# Patient Record
Sex: Female | Born: 2000 | Race: White | Hispanic: No | Marital: Single | Smoking: Former smoker
Health system: Southern US, Community
[De-identification: ages and names within clinical notes are randomized; demographics above are authoritative.]

## PROBLEM LIST (undated history)

## (undated) DIAGNOSIS — F32A Depression, unspecified: Secondary | ICD-10-CM

## (undated) DIAGNOSIS — F909 Attention-deficit hyperactivity disorder, unspecified type: Secondary | ICD-10-CM

## (undated) DIAGNOSIS — F419 Anxiety disorder, unspecified: Secondary | ICD-10-CM

## (undated) HISTORY — DX: Depression, unspecified: F32.A

## (undated) HISTORY — DX: Anxiety disorder, unspecified: F41.9

## (undated) HISTORY — PX: WISDOM TOOTH EXTRACTION: SHX21

## (undated) HISTORY — DX: Attention-deficit hyperactivity disorder, unspecified type: F90.9

## (undated) HISTORY — PX: COLONOSCOPY: SHX174

---
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
## 2021-01-15 ENCOUNTER — Other Ambulatory Visit: Payer: Self-pay | Admitting: Family Medicine

## 2021-01-20 ENCOUNTER — Other Ambulatory Visit: Payer: Self-pay

## 2021-01-20 ENCOUNTER — Telehealth: Payer: Self-pay | Admitting: Family Medicine

## 2021-01-20 MED ORDER — SERTRALINE HCL 25 MG PO TABS: ORAL_TABLET | ORAL | 0 refills | Status: DC

## 2021-01-20 NOTE — Telephone Encounter (Signed)
Pt called in asking for a refill of sertraline (ZOLOFT) 100 MG tablet . Pt was a Dr. Deirdre Peer pt but has since found a new PCP. Pt stated that first appt is 7/6 and cannot get a prescription filled until after that appt. Pt is asking just for a 30 day supply as she takes 25mg  tablet 3x's a day. Please advise.  Cb#: [id number]

## 2021-01-20 NOTE — Telephone Encounter (Signed)
Message sent to [place] to ok more med for pt due to incorrect sig on 90 day supply

## 2021-09-24 ENCOUNTER — Encounter: Payer: Self-pay | Admitting: Nurse Practitioner

## 2021-09-24 ENCOUNTER — Other Ambulatory Visit: Payer: Self-pay

## 2021-09-24 ENCOUNTER — Ambulatory Visit (INDEPENDENT_AMBULATORY_CARE_PROVIDER_SITE_OTHER): Admitting: Nurse Practitioner

## 2021-09-24 VITALS — BP 116/66 | Ht 68.5 in | Wt 127.0 lb

## 2021-09-24 DIAGNOSIS — Z01419 Encounter for gynecological examination (general) (routine) without abnormal findings: Secondary | ICD-10-CM | POA: Diagnosis not present

## 2021-09-24 DIAGNOSIS — Z30431 Encounter for routine checking of intrauterine contraceptive device: Secondary | ICD-10-CM | POA: Diagnosis not present

## 2021-09-24 DIAGNOSIS — Z113 Encounter for screening for infections with a predominantly sexual mode of transmission: Secondary | ICD-10-CM | POA: Diagnosis not present

## 2021-09-24 NOTE — Progress Notes (Signed)
° °  Amy Shields 07/09/2001 [id number]   History:  21 y.o. G0 presents for annual exam without GYN complaints. Mirena IUD inserted January 2021, occasional light spotting. Received Gardasil series. GAD, depression managed by PCP.   Gynecologic History No LMP recorded. (Menstrual status: IUD).   Contraception/Family planning: IUD Sexually active: Yes  Health Maintenance Last Pap: N/A Last mammogram: N/A Last colonoscopy: N/A Last Dexa: N/A  Past medical history, past surgical history, family history and social history were all reviewed and documented in the EPIC chart. 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   ROS:  A ROS was performed and pertinent positives and negatives are included.  Exam:  Vitals:   09/24/21 1337  BP: 116/66  Weight: 127 lb (57.6 kg)  Height: 5' 8.5" (1.74 m)    Body mass index is 19.03 kg/m.  General appearance:  Normal Thyroid:  Symmetrical, normal in size, without palpable masses or nodularity. Respiratory  Auscultation:  Clear without wheezing or rhonchi Cardiovascular  Auscultation:  Regular rate, without rubs, murmurs or gallops  Edema/varicosities:  Not grossly evident Abdominal  Soft,nontender, without masses, guarding or rebound.  Liver/spleen:  No organomegaly noted  Hernia:  None appreciated  Skin  Inspection:  Grossly normal Breasts: Not indicated per guidelines Genitourinary   Inguinal/mons:  Normal without inguinal adenopathy  External genitalia:  Normal appearing vulva with no masses, tenderness, or lesions  BUS/Urethra/Skene's glands:  Normal  Vagina:  Normal appearing with normal color and discharge, no lesions  Cervix:  Normal appearing without discharge or lesions. IUD string visible  Uterus:  Normal in size, shape and contour.  Midline and mobile, nontender  Adnexa/parametria:     Rt: Normal in size, without masses or tenderness.   Lt: Normal in size, without masses or tenderness.  Anus and perineum: Normal  Digital  rectal exam: Not indicated  Patient informed chaperone available to be present for breast and pelvic exam. Patient has requested no chaperone to be present. Patient has been advised what will be completed during breast and pelvic exam.   Assessment/Plan:  21 y.o. G0 for annual exam.   Well female exam with routine gynecological exam - Education provided on SBEs, importance of preventative screenings, current guidelines, high calcium diet, regular exercise, safe sex, and multivitamin daily.   Encounter for routine checking of intrauterine contraceptive device (IUD) - Mirena inserted 08/2019. Occasional spotting. Aware of 8-year FDA approval. IUD string visible during exam.   Screen for STD (sexually transmitted disease) - Plan: SURESWAB CT/NG/T. Vaginalis. Declines RPR, HIV.   Follow up in 1 year for annual.      [place], 1:56 PM 09/24/2021

## 2021-09-25 LAB — SURESWAB CT/NG/T. VAGINALIS
C. trachomatis RNA, TMA: NOT DETECTED
N. gonorrhoeae RNA, TMA: NOT DETECTED
Trichomonas vaginalis RNA: NOT DETECTED

## 2021-09-28 ENCOUNTER — Encounter: Payer: Self-pay | Admitting: Nurse Practitioner

## 2022-09-10 ENCOUNTER — Ambulatory Visit: Admitting: Family

## 2022-09-11 IMAGING — US US BREAST*R* LIMITED INC AXILLA
1 series · 4 of 4 positions shown · non-contrast
Comparison: None.

CLINICAL DATA: Palpable lump within the RIGHT breast.

EXAM:
ULTRASOUND OF THE RIGHT BREAST

[Series 1: us breast*right* limited inc axilla · 0.06mm/px · 4 of 4 slices shown]
[im 1/4]
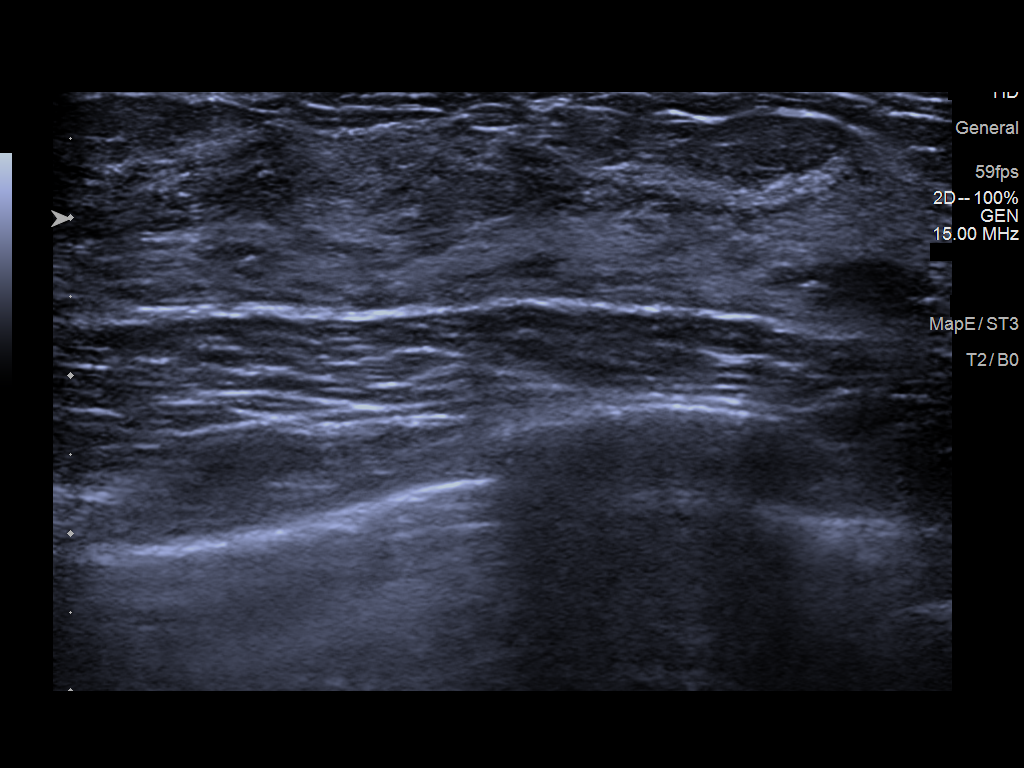
[im 2/4]
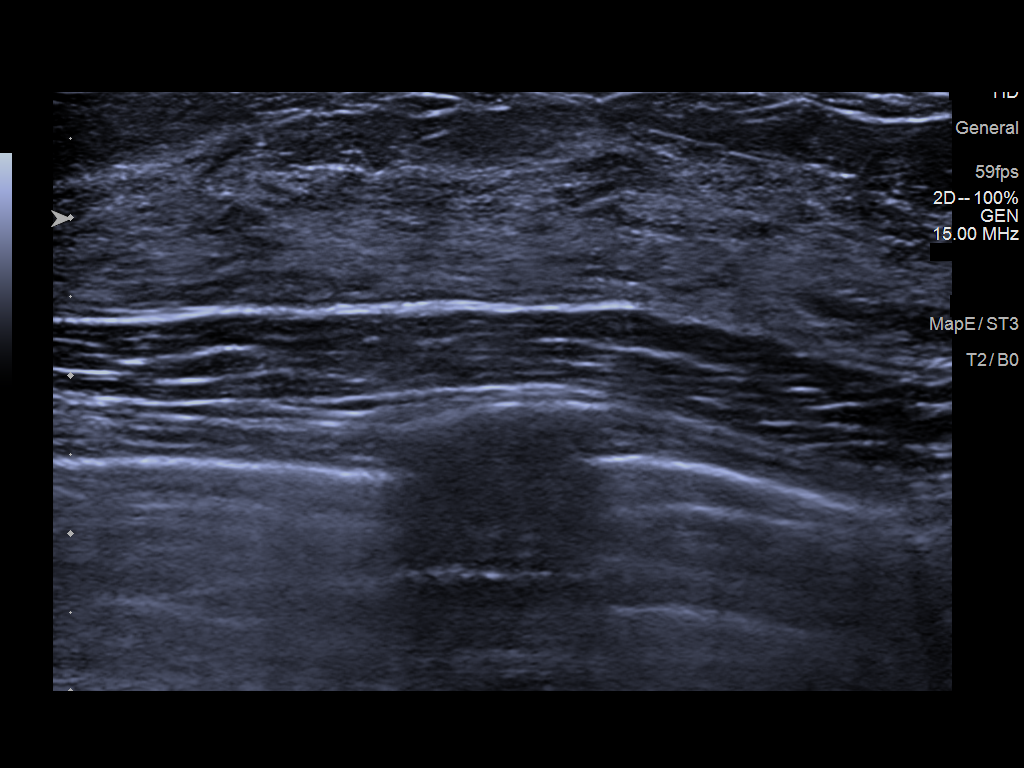
[im 3/4]
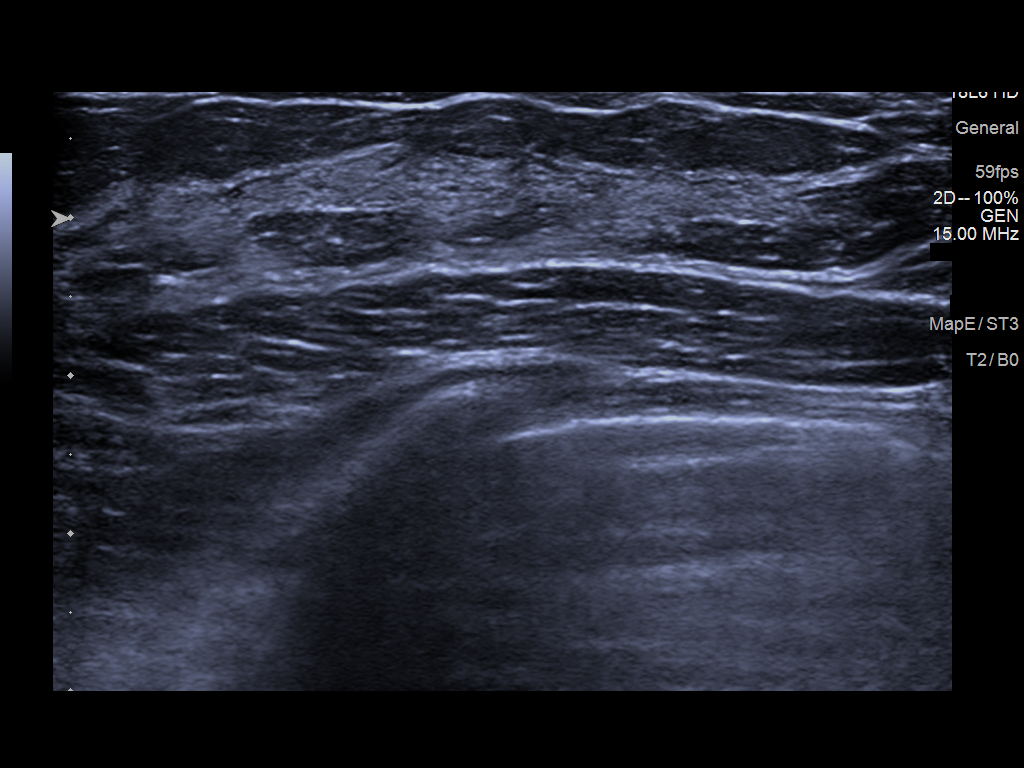
[im 4/4]
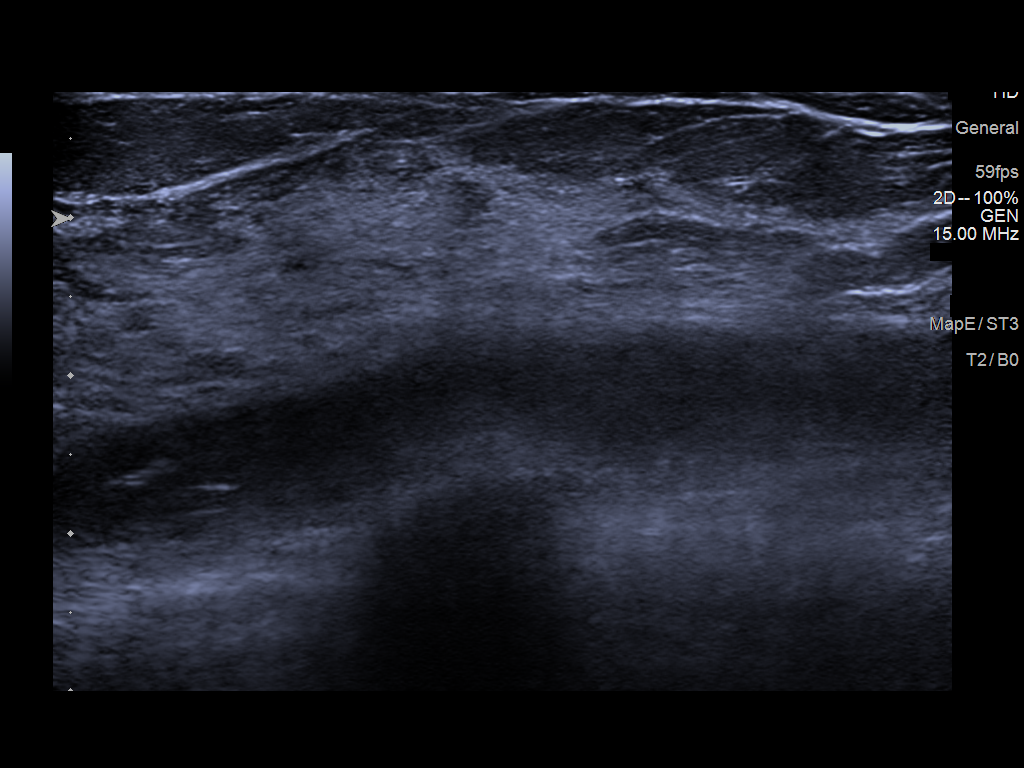

[4 of 4 positions shown; findings below may reference images not displayed]

FINDINGS: Targeted ultrasound is performed, evaluating the upper RIGHT breast
with particular attention to the 12 o'clock axis as directed by the
patient, showing only normal fibroglandular tissues and fat lobules
throughout. No solid or cystic mass. No fluid collection. There is a
ridge of normal dense fibroglandular tissue at the 12 o'clock axis,
corresponding to the palpable area of concern.
IMPRESSION: No evidence of malignancy or acute findings within the upper RIGHT
breast. Ridge of normal dense fibroglandular tissue at the 12
o'clock axis, corresponding to the palpable area of concern.

RECOMMENDATION:
1. Screening mammogram at age 40 unless there are persistent or
intervening clinical concerns. (Code:PT-L-D8Q)
2. The patient was instructed to return sooner if a new palpable
abnormality is identified in either breast.

I have discussed the findings and recommendations with the patient.
If applicable, a reminder letter will be sent to the patient
regarding the next appointment.

BI-RADS CATEGORY  1: Negative.

## 2022-09-24 ENCOUNTER — Encounter: Payer: Self-pay | Admitting: Nurse Practitioner

## 2022-09-27 ENCOUNTER — Encounter: Payer: Self-pay | Admitting: Nurse Practitioner

## 2022-09-27 ENCOUNTER — Ambulatory Visit (INDEPENDENT_AMBULATORY_CARE_PROVIDER_SITE_OTHER): Admitting: Nurse Practitioner

## 2022-09-27 ENCOUNTER — Other Ambulatory Visit (HOSPITAL_COMMUNITY)
Admission: RE | Admit: 2022-09-27 | Discharge: 2022-09-27 | Disposition: A | Discharge status: Home / Self Care | Source: Ambulatory Visit | Attending: Nurse Practitioner | Admitting: Nurse Practitioner

## 2022-09-27 VITALS — BP 112/70 | HR 83 | Ht 68.5 in | Wt 139.0 lb

## 2022-09-27 DIAGNOSIS — Z01419 Encounter for gynecological examination (general) (routine) without abnormal findings: Secondary | ICD-10-CM | POA: Diagnosis not present

## 2022-09-27 DIAGNOSIS — Z113 Encounter for screening for infections with a predominantly sexual mode of transmission: Secondary | ICD-10-CM | POA: Diagnosis not present

## 2022-09-27 DIAGNOSIS — Z124 Encounter for screening for malignant neoplasm of cervix: Secondary | ICD-10-CM | POA: Diagnosis not present

## 2022-09-27 DIAGNOSIS — Z30431 Encounter for routine checking of intrauterine contraceptive device: Secondary | ICD-10-CM | POA: Diagnosis not present

## 2022-09-27 NOTE — Progress Notes (Signed)
   Amy Shields 05/28/2001 [id number]   History:  22 y.o. G0 presents for annual exam without GYN complaints. Mirena IUD inserted January 2021, occasional light spotting. Received Gardasil series. GAD, depression managed by PCP. Would like STD screening today.   Gynecologic History Patient's last menstrual period was 09/27/2022 (exact date).   Contraception/Family planning: IUD Sexually active: Yes  Health Maintenance Last Pap: Never Last mammogram: Not indicated Last colonoscopy: Not indicated Last Dexa: Not indicated  Past medical history, past surgical history, family history and social history were all reviewed and documented in the EPIC chart. 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   ROS:  A ROS was performed and pertinent positives and negatives are included.  Exam:  Vitals:   09/27/22 1345  BP: 112/70  Pulse: 83  SpO2: 97%  Weight: 139 lb (63 kg)  Height: 5' 8.5" (1.74 m)    Body mass index is 20.83 kg/m.  General appearance:  Normal Thyroid:  Symmetrical, normal in size, without palpable masses or nodularity. Respiratory  Auscultation:  Clear without wheezing or rhonchi Cardiovascular  Auscultation:  Regular rate, without rubs, murmurs or gallops  Edema/varicosities:  Not grossly evident Abdominal  Soft,nontender, without masses, guarding or rebound.  Liver/spleen:  No organomegaly noted  Hernia:  None appreciated  Skin  Inspection:  Grossly normal Breasts: Not indicated per guidelines Genitourinary   Inguinal/mons:  Normal without inguinal adenopathy  External genitalia:  Normal appearing vulva with no masses, tenderness, or lesions  BUS/Urethra/Skene's glands:  Normal  Vagina:  Normal appearing with normal color and discharge, no lesions  Cervix:  Normal appearing without discharge or lesions. IUD string visible  Uterus:  Normal in size, shape and contour.  Midline and mobile, nontender  Adnexa/parametria:     Rt: Normal in size, without masses or  tenderness.   Lt: Normal in size, without masses or tenderness.  Anus and perineum: Normal  Digital rectal exam: Not indicated  Patient informed chaperone available to be present for breast and pelvic exam. Patient has requested no chaperone to be present. Patient has been advised what will be completed during breast and pelvic exam.   Assessment/Plan:  22 y.o. G0 for annual exam.   Well female exam with routine gynecological exam - Education provided on SBEs, importance of preventative screenings, current guidelines, high calcium diet, regular exercise, safe sex, and multivitamin daily.   Screening for cervical cancer - Plan: Cytology - PAP( [place]). Initial pap today.   Screening examination for STD (sexually transmitted disease) - Plan: Cytology - PAP( [place]). GC/CT/trich added to pap. Declines HIV/RPR. Would like to get with preventative labs at PCP visit.   Encounter for routine checking of intrauterine contraceptive device (IUD) - Mirena inserted 08/2019. Occasional spotting. Aware of 8-year FDA approval. IUD string visible during exam.   Follow up in 1 year for annual.      Tamela Gammon [place], 2:02 PM 09/27/2022

## 2022-09-29 LAB — CYTOLOGY - PAP
Chlamydia: NEGATIVE
Comment: NEGATIVE
Comment: NEGATIVE
Comment: NORMAL
Diagnosis: NEGATIVE
Neisseria Gonorrhea: NEGATIVE
Trichomonas: NEGATIVE

## 2022-10-29 ENCOUNTER — Ambulatory Visit: Admitting: Family

## 2022-11-01 ENCOUNTER — Other Ambulatory Visit: Payer: Self-pay

## 2022-11-01 MED ORDER — AMPHETAMINE-DEXTROAMPHETAMINE 10 MG PO TABS: 10.0000 mg | ORAL_TABLET | Freq: Every day | ORAL | 0 refills | Status: DC

## 2022-11-01 MED ORDER — SERTRALINE HCL 100 MG PO TABS: ORAL_TABLET | ORAL | 1 refills | Status: DC

## 2022-11-08 ENCOUNTER — Encounter: Payer: Self-pay | Admitting: Family

## 2022-11-08 ENCOUNTER — Ambulatory Visit (INDEPENDENT_AMBULATORY_CARE_PROVIDER_SITE_OTHER): Admitting: Family

## 2022-11-08 VITALS — BP 110/70 | HR 75 | Ht 70.0 in | Wt 137.6 lb

## 2022-11-08 DIAGNOSIS — L700 Acne vulgaris: Secondary | ICD-10-CM

## 2022-11-08 DIAGNOSIS — F509 Eating disorder, unspecified: Secondary | ICD-10-CM

## 2022-11-08 DIAGNOSIS — H527 Unspecified disorder of refraction: Secondary | ICD-10-CM | POA: Insufficient documentation

## 2022-11-08 DIAGNOSIS — M79669 Pain in unspecified lower leg: Secondary | ICD-10-CM | POA: Insufficient documentation

## 2022-11-08 DIAGNOSIS — F900 Attention-deficit hyperactivity disorder, predominantly inattentive type: Secondary | ICD-10-CM

## 2022-11-08 DIAGNOSIS — L209 Atopic dermatitis, unspecified: Secondary | ICD-10-CM

## 2022-11-08 DIAGNOSIS — F411 Generalized anxiety disorder: Secondary | ICD-10-CM

## 2022-11-08 DIAGNOSIS — M79609 Pain in unspecified limb: Secondary | ICD-10-CM | POA: Insufficient documentation

## 2022-11-08 DIAGNOSIS — D225 Melanocytic nevi of trunk: Secondary | ICD-10-CM

## 2022-11-08 HISTORY — DX: Melanocytic nevi of trunk: D22.5

## 2022-11-08 HISTORY — DX: Pain in unspecified lower leg: M79.669

## 2022-11-08 HISTORY — DX: Acne vulgaris: L70.0

## 2022-11-08 HISTORY — DX: Atopic dermatitis, unspecified: L20.9

## 2022-11-08 MED ORDER — AMPHETAMINE-DEXTROAMPHETAMINE 10 MG PO TABS: 10.0000 mg | ORAL_TABLET | Freq: Every day | ORAL | 0 refills | Status: AC

## 2022-11-08 NOTE — Progress Notes (Signed)
Established Patient Office Visit  Subjective:  Patient ID: Amy Shields, female    DOB: Feb 22, 2001  Age: 22 y.o. MRN: [id number]  Chief Complaint  Patient presents with   Follow-up    3 month    Patient is here today for her 3 months follow up.  She has been feeling well since last appointment.   She does not have additional concerns to discuss today.  Labs are not due today. She needs refills.   I have reviewed her active problem list, medication list, allergies, family history, social history, notes from last encounter for her appointment today.  No other concerns at this time.   Past Medical History:  Diagnosis Date   Acne vulgaris 11/08/2022   ADHD    Anxiety    Phreesia 06/04/2020   Atopic dermatitis 11/08/2022   Depression    Phreesia 06/04/2020   Nevus of back 11/08/2022   Pain in unspecified lower leg 11/08/2022    Past Surgical History:  Procedure Laterality Date   WISDOM TOOTH EXTRACTION      Social History   Socioeconomic History   Marital status: Single    Spouse name: Not on file   Number of children: Not on file   Years of education: Not on file   Highest education level: Not on file  Occupational History   Not on file  Tobacco Use   Smoking status: Former    Types: Cigarettes    Quit date: 07/17/2018    Years since quitting: 4.3   Smokeless tobacco: Never  Substance and Sexual Activity   Alcohol use: Yes    Comment: occasionally   Drug use: Yes    Types: Marijuana   Sexual activity: Yes    Partners: Male    Birth control/protection: I.U.D.    Comment: IUD (Mirena inserted on 08/31/2019), First IC <16 y/o  Other Topics Concern   Not on file  Social History Narrative   Not on file   Social Determinants of Health   Financial Resource Strain: Not on file  Food Insecurity: Not on file  Transportation Needs: Not on file  Physical Activity: Not on file  Stress: Not on file  Social Connections: Not on file  Intimate Partner  Violence: Not on file    Family History  Problem Relation Age of Onset   Hypercholesterolemia Father    Hypercholesterolemia Paternal Grandfather     No Known Allergies  Review of Systems  All other systems reviewed and are negative.      Objective:   BP 110/70   Pulse 75   Ht 5\' 10"  (1.778 m)   Wt 137 lb 9.6 oz (62.4 kg)   SpO2 98%   BMI 19.74 kg/m   Vitals:   11/08/22 1257  BP: 110/70  Pulse: 75  Height: 5\' 10"  (1.778 m)  Weight: 137 lb 9.6 oz (62.4 kg)  SpO2: 98%  BMI (Calculated): 19.74    Physical Exam Vitals and nursing note reviewed.  Constitutional:      Appearance: Normal appearance. She is normal weight.  HENT:     Head: Normocephalic.  Eyes:     Pupils: Pupils are equal, round, and reactive to light.  Cardiovascular:     Rate and Rhythm: Normal rate.  Pulmonary:     Effort: Pulmonary effort is normal.  Neurological:     Mental Status: She is alert.      No results found for any visits on 11/08/22.  Recent Results (from the  past 2160 hour(s))  Cytology - PAP( [place])     Status: None   Collection Time: 09/27/22  2:03 PM  Result Value Ref Range   Neisseria Gonorrhea Negative    Chlamydia Negative    Trichomonas Negative    Adequacy      Satisfactory for evaluation; transformation zone component PRESENT.   Diagnosis      - Negative for intraepithelial lesion or malignancy (NILM)   Comment Normal Reference [place] Chlamydia - Negative    Comment      Normal Reference Range Neisseria Gonorrhea - Negative   Comment Normal Reference Range Trichomonas - Negative       Assessment & Plan:   Problem List Items Addressed This Visit     GAD (generalized anxiety disorder) - Primary   Eating disorder   ADHD (attention deficit hyperactivity disorder), inattentive type    Return in about 4 months (around 03/10/2023) for F/U.   Total time spent: 20 minutes  Mechele Claude, FNP  11/08/2022

## 2023-03-10 ENCOUNTER — Ambulatory Visit: Admitting: Family

## 2023-03-17 ENCOUNTER — Encounter: Payer: Self-pay | Admitting: Family

## 2023-03-17 ENCOUNTER — Ambulatory Visit (INDEPENDENT_AMBULATORY_CARE_PROVIDER_SITE_OTHER): Admitting: Family

## 2023-03-17 VITALS — BP 115/69 | HR 51 | Ht 70.0 in | Wt 144.6 lb

## 2023-03-17 DIAGNOSIS — R10817 Generalized abdominal tenderness: Secondary | ICD-10-CM | POA: Diagnosis not present

## 2023-03-17 DIAGNOSIS — F321 Major depressive disorder, single episode, moderate: Secondary | ICD-10-CM

## 2023-03-17 DIAGNOSIS — R1084 Generalized abdominal pain: Secondary | ICD-10-CM | POA: Diagnosis not present

## 2023-03-17 DIAGNOSIS — F900 Attention-deficit hyperactivity disorder, predominantly inattentive type: Secondary | ICD-10-CM

## 2023-03-17 DIAGNOSIS — F411 Generalized anxiety disorder: Secondary | ICD-10-CM | POA: Diagnosis not present

## 2023-03-17 DIAGNOSIS — F509 Eating disorder, unspecified: Secondary | ICD-10-CM

## 2023-03-17 NOTE — Assessment & Plan Note (Signed)
Patient stable.  Well controlled with current therapy.   Continue current meds.  

## 2023-03-17 NOTE — Progress Notes (Signed)
Established Patient Office Visit  Subjective:  Patient ID: Amy Shields, female    DOB: 2000-09-25  Age: 22 y.o. MRN: [id number]  Chief Complaint  Patient presents with   Follow-up    4 mo    Patient is here today for her 3 months follow up.  She has been feeling fairly well since last appointment.   She does have additional concerns to discuss today.  She says that she has been hurting to have any pressure on her abdomen at all.  Asks if we can get this checked out. She does have the history of an eating disorder, and originally she thought his might be related, but it has been significant enough that she is having to unbutton pants to relieve the pressure.    Labs are not due today. She needs refills.   I have reviewed her active problem list, medication list, allergies, notes from last encounter, lab results for her appointment today.    No other concerns at this time.   Past Medical History:  Diagnosis Date   Acne vulgaris 11/08/2022   ADHD    Anxiety    Phreesia 06/04/2020   Atopic dermatitis 11/08/2022   Depression    Phreesia 06/04/2020   Nevus of back 11/08/2022   Pain in unspecified lower leg 11/08/2022    Past Surgical History:  Procedure Laterality Date   WISDOM TOOTH EXTRACTION      Social History   Socioeconomic History   Marital status: Single    Spouse name: Not on file   Number of children: Not on file   Years of education: Not on file   Highest education level: Not on file  Occupational History   Not on file  Tobacco Use   Smoking status: Former    Current packs/day: 0.00    Types: Cigarettes    Quit date: 07/17/2018    Years since quitting: 4.6   Smokeless tobacco: Never  Substance and Sexual Activity   Alcohol use: Yes    Comment: occasionally   Drug use: Yes    Types: Marijuana   Sexual activity: Yes    Partners: Male    Birth control/protection: I.U.D.    Comment: IUD (Mirena inserted on 08/31/2019), First IC <16 y/o  Other  Topics Concern   Not on file  Social History Narrative   Not on file   Social Determinants of Health   Financial Resource Strain: Not on file  Food Insecurity: Not on file  Transportation Needs: Not on file  Physical Activity: Not on file  Stress: Not on file  Social Connections: Not on file  Intimate Partner Violence: Not on file    Family History  Problem Relation Age of Onset   Hypercholesterolemia Father    Hypercholesterolemia Paternal Grandfather     No Known Allergies  Review of Systems  All other systems reviewed and are negative.      Objective:   BP 115/69   Pulse (!) 51   Ht 5\' 10"  (1.778 m)   Wt 144 lb 9.6 oz (65.6 kg)   SpO2 97%   BMI 20.75 kg/m   Vitals:   03/17/23 1532  BP: 115/69  Pulse: (!) 51  Height: 5\' 10"  (1.778 m)  Weight: 144 lb 9.6 oz (65.6 kg)  SpO2: 97%  BMI (Calculated): 20.75    Physical Exam Vitals and nursing note reviewed.  Constitutional:      Appearance: Normal appearance. She is normal weight.  HENT:  Head: Normocephalic.  Eyes:     Extraocular Movements: Extraocular movements intact.     Conjunctiva/sclera: Conjunctivae normal.     Pupils: Pupils are equal, round, and reactive to light.  Cardiovascular:     Rate and Rhythm: Normal rate.  Pulmonary:     Effort: Pulmonary effort is normal.  Abdominal:     General: Abdomen is flat. Bowel sounds are normal.     Tenderness: There is abdominal tenderness. There is guarding. There is no rebound.  Neurological:     General: No focal deficit present.     Mental Status: She is alert and oriented to person, place, and time. Mental status is at baseline.  Psychiatric:        Mood and Affect: Mood normal.        Behavior: Behavior normal.        Thought Content: Thought content normal.        Judgment: Judgment normal.      No results found for any visits on 03/17/23.  No results found for this or any previous visit (from the past 2160 hour(s)).      Assessment & Plan:   Problem List Items Addressed This Visit       Active Problems   MDD (major depressive disorder)    Patient stable.  Well controlled with current therapy.   Continue current meds.       GAD (generalized anxiety disorder)    Patient stable.  Well controlled with current therapy.   Continue current meds.       ADHD (attention deficit hyperactivity disorder), inattentive type    Patient stable.  Well controlled with current therapy.   Continue current meds.       Other Visit Diagnoses     Generalized abdominal pain    -  Primary   Relevant Orders   CT ABDOMEN PELVIS W WO CONTRAST   Generalized abdominal tenderness without rebound tenderness       ordering CT scan for abdominal pain.  Will send referral to GI if needed.   Relevant Orders   CT ABDOMEN PELVIS W WO CONTRAST       Return in about 3 months (around 06/17/2023) for F/U.   Total time spent: 20 minutes  Miki Kins, FNP  03/17/2023   This document may have been prepared by [facility] Voice Recognition software and as such may include unintentional dictation errors.

## 2023-03-29 ENCOUNTER — Ambulatory Visit (INDEPENDENT_AMBULATORY_CARE_PROVIDER_SITE_OTHER)

## 2023-03-29 DIAGNOSIS — R10817 Generalized abdominal tenderness: Secondary | ICD-10-CM

## 2023-03-29 DIAGNOSIS — R1084 Generalized abdominal pain: Secondary | ICD-10-CM

## 2023-03-29 MED ORDER — IOHEXOL 300 MG/ML  SOLN
100.0000 mL | Freq: Once | INTRAMUSCULAR | Status: AC | PRN
  Administered 2023-03-29: 100 mL via INTRAVENOUS

## 2023-04-04 ENCOUNTER — Other Ambulatory Visit: Payer: Self-pay | Admitting: Family

## 2023-04-05 NOTE — Addendum Note (Signed)
Addended by: Grayling Congress on: 04/05/2023 10:01 AM   Modules accepted: Orders

## 2023-04-19 ENCOUNTER — Encounter

## 2023-05-09 ENCOUNTER — Ambulatory Visit

## 2023-05-09 DIAGNOSIS — R109 Unspecified abdominal pain: Secondary | ICD-10-CM | POA: Diagnosis not present

## 2023-05-09 DIAGNOSIS — B3781 Candidal esophagitis: Secondary | ICD-10-CM | POA: Diagnosis not present

## 2023-05-09 DIAGNOSIS — R933 Abnormal findings on diagnostic imaging of other parts of digestive tract: Secondary | ICD-10-CM | POA: Diagnosis present

## 2023-05-09 DIAGNOSIS — K529 Noninfective gastroenteritis and colitis, unspecified: Secondary | ICD-10-CM | POA: Diagnosis not present

## 2023-06-16 ENCOUNTER — Ambulatory Visit: Admitting: Family

## 2023-12-26 ENCOUNTER — Ambulatory Visit: Admitting: Family

## 2023-12-26 ENCOUNTER — Encounter: Payer: Self-pay | Admitting: Family

## 2023-12-26 VITALS — BP 98/52 | HR 84 | Ht 70.0 in | Wt 133.4 lb

## 2023-12-26 DIAGNOSIS — F411 Generalized anxiety disorder: Secondary | ICD-10-CM | POA: Diagnosis not present

## 2023-12-26 DIAGNOSIS — F321 Major depressive disorder, single episode, moderate: Secondary | ICD-10-CM

## 2023-12-26 MED ORDER — SERTRALINE HCL 25 MG PO TABS: 25.0000 mg | ORAL_TABLET | Freq: Every day | ORAL | 1 refills | Status: AC

## 2023-12-26 MED ORDER — SERTRALINE HCL 100 MG PO TABS: ORAL_TABLET | ORAL | 1 refills | Status: AC

## 2023-12-28 ENCOUNTER — Ambulatory Visit (INDEPENDENT_AMBULATORY_CARE_PROVIDER_SITE_OTHER): Payer: Self-pay | Admitting: Nurse Practitioner

## 2023-12-28 ENCOUNTER — Encounter: Payer: Self-pay | Admitting: Nurse Practitioner

## 2023-12-28 VITALS — BP 110/62 | HR 86 | Ht 69.0 in | Wt 133.6 lb

## 2023-12-28 DIAGNOSIS — Z01419 Encounter for gynecological examination (general) (routine) without abnormal findings: Secondary | ICD-10-CM | POA: Diagnosis not present

## 2023-12-28 DIAGNOSIS — N898 Other specified noninflammatory disorders of vagina: Secondary | ICD-10-CM

## 2023-12-28 DIAGNOSIS — Z30431 Encounter for routine checking of intrauterine contraceptive device: Secondary | ICD-10-CM | POA: Diagnosis not present

## 2023-12-28 DIAGNOSIS — Z113 Encounter for screening for infections with a predominantly sexual mode of transmission: Secondary | ICD-10-CM | POA: Diagnosis not present

## 2023-12-28 DIAGNOSIS — Z1331 Encounter for screening for depression: Secondary | ICD-10-CM

## 2023-12-28 DIAGNOSIS — B9689 Other specified bacterial agents as the cause of diseases classified elsewhere: Secondary | ICD-10-CM

## 2023-12-28 DIAGNOSIS — N760 Acute vaginitis: Secondary | ICD-10-CM

## 2023-12-28 LAB — WET PREP FOR TRICH, YEAST, CLUE

## 2023-12-28 MED ORDER — FLUCONAZOLE 150 MG PO TABS: 150.0000 mg | ORAL_TABLET | Freq: Once | ORAL | 0 refills | Status: AC

## 2023-12-28 MED ORDER — METRONIDAZOLE 0.75 % VA GEL: 1.0000 | Freq: Every day | VAGINAL | 0 refills | Status: AC

## 2023-12-28 NOTE — Progress Notes (Signed)
 Amy Shields 2000-09-10 [id number]   History:  23 y.o. G0 presents for annual exam. Complains of vaginal odor x 1 week. Denies itching or discharge. Would like STD screening today. Mirena IUD inserted January 2021, occasional light spotting. Received Gardasil series. GAD, depression managed by PCP.   Gynecologic History No LMP recorded. (Menstrual status: IUD). Period Cycle (Days):  (amenorrhea with Mirena) Contraception/Family planning: IUD Sexually active: Yes  Health Maintenance Last Pap: 09/27/2022. Results were: Normal Last mammogram: Not indicated Last colonoscopy: Not indicated Last Dexa: Not indicated     12/28/2023    9:58 AM  Depression screen PHQ 2/9  Decreased Interest 3  Down, Depressed, Hopeless 3  PHQ - 2 Score 6  Altered sleeping 3  Tired, decreased energy 3  Change in appetite 3  Feeling bad or failure about yourself  1  Trouble concentrating 2  Moving slowly or fidgety/restless 1  Suicidal thoughts 0  PHQ-9 Score 19  Difficult doing work/chores Very difficult     Past medical history, past surgical history, family history and social history were all reviewed and documented in the EPIC chart. 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 Has some job offers.   ROS:  A ROS was performed and pertinent positives and negatives are included.  Exam:  Vitals:   12/28/23 0956  BP: 110/62  Pulse: 86  SpO2: 98%  Weight: 133 lb 9.6 oz (60.6 kg)  Height: 5\' 9"  (1.753 m)     Body mass index is 19.73 kg/m.  General appearance:  Normal Thyroid:  Symmetrical, normal in size, without palpable masses or nodularity. Respiratory  Auscultation:  Clear without wheezing or rhonchi Cardiovascular  Auscultation:  Regular rate, without rubs, murmurs or gallops  Edema/varicosities:  Not grossly evident Abdominal  Soft,nontender, without masses, guarding or rebound.  Liver/spleen:  No organomegaly noted  Hernia:  None appreciated  Skin  Inspection:  Grossly  normal Breasts: Not indicated per guidelines Pelvic: External genitalia:  no lesions              Urethra:  normal appearing urethra with no masses, tenderness or lesions              Bartholins and Skenes: normal                 Vagina: +discharge and odor, no erythema              Cervix: no lesions. + IUD strings Bimanual Exam:  Uterus:  no masses or tenderness              Adnexa: no mass, fullness, tenderness              Rectovaginal: Deferred              Anus:  normal, no lesions  Patient informed chaperone available to be present for breast and pelvic exam. Patient has requested no chaperone to be present. Patient has been advised what will be completed during breast and pelvic exam.   Wet prep + clue cells (+ odor)  Assessment/Plan:  23 y.o. G0 for annual exam.   Well female exam with routine gynecological exam - Education provided on SBEs, importance of preventative screenings, current guidelines, high calcium diet, regular exercise, safe sex, and multivitamin daily.   Positive screening for depression on 9-item Patient Health Questionnaire (PHQ-9) - PCP manages. Increased Zoloft  a couple of days ago. Not currently seeing therapist but considering restarting this summer. Just feels she is struggling  with life changes with graduating college.   Screening examination for STD (sexually transmitted disease) - Plan: C. trachomatis/N. gonorrhoeae RNA, RPR, HIV Antibody (routine testing w rflx)  Bacterial vaginosis - Plan: metroNIDAZOLE (METROGEL) 0.75 % vaginal gel nightly x 5 nights, fluconazole (DIFLUCAN) 150 MG tablet once if needed. Worried about getting a yeast infection.   Vaginal odor - Plan: WET PREP FOR TRICH, YEAST, CLUE. + clue cells.   Encounter for routine checking of intrauterine contraceptive device (IUD) - Mirena inserted 08/2019. Occasional spotting. Aware of 8-year FDA approval.   Screening for cervical cancer - Normal pap history. Will repeat at 3-year interval  per guidelines.   Return in about 1 year (around 12/27/2024) for Annual.     Andee Bamberger [facility], 10:11 AM 12/28/2023

## 2023-12-29 LAB — C. TRACHOMATIS/N. GONORRHOEAE RNA
C. trachomatis RNA, TMA: NOT DETECTED
N. gonorrhoeae RNA, TMA: NOT DETECTED

## 2023-12-29 LAB — HIV ANTIBODY (ROUTINE TESTING W REFLEX): HIV 1&2 Ab, 4th Generation: NONREACTIVE

## 2023-12-29 LAB — RPR: RPR Ser Ql: NONREACTIVE

## 2024-01-01 ENCOUNTER — Encounter: Payer: Self-pay | Admitting: Family

## 2024-01-01 NOTE — Progress Notes (Signed)
 Established Patient Office Visit  Subjective:  Patient ID: Amy Shields, female    DOB: 2000/12/13  Age: 23 y.o. MRN: [id number]  Chief Complaint  Patient presents with   Follow-up    Patient is here today for her  follow up.  She has been feeling fairly well since last appointment.   She does have additional concerns to discuss today.  Says that she has been having more anxiety/depressive symptoms and asks if we can increase her sertraline  again.   Labs are not due today. She needs refills.   I have reviewed her active problem list, medication list, allergies, notes from last encounter, lab results for her appointment today.      No other concerns at this time.   Past Medical History:  Diagnosis Date   Acne vulgaris 11/08/2022   ADHD    Anxiety    Phreesia 06/04/2020   Atopic dermatitis 11/08/2022   Depression    Phreesia 06/04/2020   Nevus of back 11/08/2022   Pain in unspecified lower leg 11/08/2022    Past Surgical History:  Procedure Laterality Date   COLONOSCOPY     2024   WISDOM TOOTH EXTRACTION      Social History   Socioeconomic History   Marital status: Single    Spouse name: Not on file   Number of children: Not on file   Years of education: Not on file   Highest education level: Not on file  Occupational History   Not on file  Tobacco Use   Smoking status: Former    Current packs/day: 0.00    Types: Cigarettes    Quit date: 07/17/2018    Years since quitting: 5.4   Smokeless tobacco: Never  Substance and Sexual Activity   Alcohol use: Yes    Comment: occasionally   Drug use: Yes    Types: Marijuana   Sexual activity: Yes    Partners: Male    Birth control/protection: I.U.D.    Comment: IUD (Mirena inserted on 08/31/2019), First IC <17 y/o  Other Topics Concern   Not on file  Social History Narrative   Not on file   Social Drivers of Health   Financial Resource Strain: Not on file  Food Insecurity: Not on file   Transportation Needs: Not on file  Physical Activity: Not on file  Stress: Not on file  Social Connections: Not on file  Intimate Partner Violence: Not on file    Family History  Problem Relation Age of Onset   Hypercholesterolemia Father    Heart Problems Father        pace maker   Hypercholesterolemia Paternal Grandfather     Allergies  Allergen Reactions   Latex Other (See Comments)    Latex condoms cause burning    Review of Systems  All other systems reviewed and are negative.      Objective:   BP (!) 98/52   Pulse 84   Ht 5\' 10"  (1.778 m)   Wt 133 lb 6.4 oz (60.5 kg)   SpO2 97%   BMI 19.14 kg/m   Vitals:   12/26/23 1526  BP: (!) 98/52  Pulse: 84  Height: 5\' 10"  (1.778 m)  Weight: 133 lb 6.4 oz (60.5 kg)  SpO2: 97%  BMI (Calculated): 19.14    Physical Exam Vitals and nursing note reviewed.  Constitutional:      Appearance: Normal appearance. She is normal weight.  HENT:     Head: Normocephalic.  Eyes:  Extraocular Movements: Extraocular movements intact.     Conjunctiva/sclera: Conjunctivae normal.     Pupils: Pupils are equal, round, and reactive to light.  Cardiovascular:     Rate and Rhythm: Normal rate.  Pulmonary:     Effort: Pulmonary effort is normal.  Neurological:     General: No focal deficit present.     Mental Status: She is alert and oriented to person, place, and time. Mental status is at baseline.  Psychiatric:        Mood and Affect: Mood normal.        Behavior: Behavior normal.        Thought Content: Thought content normal.        Judgment: Judgment normal.      No results found for any visits on 12/26/23.  Recent Results (from the past 2160 hours)  WET PREP FOR TRICH, YEAST, CLUE     Status: None   Collection Time: 12/28/23 10:17 AM   Specimen: Genital  Result Value Ref Range   Source: VAGINA    RESULT      Comment: EPITHELIAL CELLS-PRESENT CLUE CELLS-PRESENT (POSITIVE ODOR) YEAST-NONE  SEEN TRICHOMONAS-NONE SEEN WBC-MODERATE BACTERIA-MANY EPITH. CELLS (13-20) PER HPF   C. trachomatis/N. gonorrhoeae RNA     Status: None   Collection Time: 12/28/23 10:17 AM   Specimen: Genital  Result Value Ref Range   C. trachomatis RNA, TMA NOT DETECTED NOT DETECTED   N. gonorrhoeae RNA, TMA NOT DETECTED NOT DETECTED    Comment: The analytical performance characteristics of this assay, when used to test SurePath(TM) specimens have been determined by [employer]. The modifications have not been cleared or approved by the FDA. This assay has been validated pursuant to the CLIA regulations and is used for clinical purposes. . For additional information, please refer to https://education.[facility].com/faq/FAQ154 (This link is being provided for information/ educational purposes only.) .   RPR     Status: None   Collection Time: 12/28/23 10:21 AM  Result Value Ref Range   RPR Ser Ql NON-REACTIVE NON-REACTIVE    Comment: . No laboratory evidence of syphilis. If recent exposure is suspected, submit a new sample in 2-4 weeks. Aaron Aas   HIV Antibody (routine testing w rflx)     Status: None   Collection Time: 12/28/23 10:21 AM  Result Value Ref Range   HIV 1&2 Ab, 4th Generation NON-REACTIVE NON-REACTIVE    Comment: HIV-1 antigen and HIV-1/HIV-2 antibodies were not detected. There is no laboratory evidence of HIV infection. Aaron Aas PLEASE NOTE: This information has been disclosed to you from records whose confidentiality may be protected by state law.  If your state requires such protection, then the state law prohibits you from making any further disclosure of the information without the specific written consent of the person to whom it pertains, or as otherwise permitted by law. A general authorization for the release of medical or other information is NOT sufficient for this purpose. . For additional information please refer  to http://education.[facility].com/faq/FAQ106 (This link is being provided for informational/ educational purposes only.) . Aaron Aas The performance of this assay has not been clinically validated in patients less than 70 years old. .        Assessment & Plan:   Problem List Items Addressed This Visit       Other   MDD (major depressive disorder) - Primary   Relevant Medications   sertraline  (ZOLOFT ) 25 MG tablet   sertraline  (ZOLOFT ) 100 MG tablet   GAD (generalized  anxiety disorder)   Relevant Medications   sertraline  (ZOLOFT ) 25 MG tablet   sertraline  (ZOLOFT ) 100 MG tablet   Increase Sertraline  dose.  Continue current other treatment.  Patient educated on plan and verbalizes understanding.   Return in about 6 months (around 06/27/2024).   Total time spent: 20 minutes  Trenda Frisk, FNP  12/26/2023   This document may have been prepared by [facility] Voice Recognition software and as such may include unintentional dictation errors.

## 2024-01-03 ENCOUNTER — Ambulatory Visit: Payer: Self-pay | Admitting: Nurse Practitioner

## 2024-01-24 NOTE — Telephone Encounter (Signed)
 Repeat metrogel  x 5 at hs of Flagyl  500mg  po BID x 7 days, if no improvement will need OV.

## 2024-01-24 NOTE — Telephone Encounter (Signed)
 Last treated for BV on 12/28/23 with metrogel .   Routing to covering provider to review and advise.

## 2024-01-26 MED ORDER — METRONIDAZOLE 0.75 % VA GEL: 1.0000 | Freq: Every day | VAGINAL | 0 refills | Status: AC

## 2024-01-30 ENCOUNTER — Encounter: Payer: Self-pay | Admitting: Family

## 2024-04-27 ENCOUNTER — Ambulatory Visit: Admitting: Family

## 2025-01-01 ENCOUNTER — Ambulatory Visit: Payer: Self-pay | Admitting: Nurse Practitioner
